# Patient Record
Sex: Female | Born: 2000 | Race: White | Hispanic: Yes | Marital: Single | Smoking: Never smoker
Health system: Southern US, Community
[De-identification: ages and names within clinical notes are randomized; demographics above are authoritative.]

## PROBLEM LIST (undated history)

## (undated) DIAGNOSIS — J302 Other seasonal allergic rhinitis: Secondary | ICD-10-CM

## (undated) HISTORY — DX: Other seasonal allergic rhinitis: J30.2

---
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
## 2020-11-04 ENCOUNTER — Other Ambulatory Visit: Payer: Self-pay

## 2020-11-04 ENCOUNTER — Encounter: Payer: Self-pay | Admitting: Family Medicine

## 2020-11-04 ENCOUNTER — Encounter: Payer: Medicaid Other | Admitting: Family Medicine

## 2020-11-04 ENCOUNTER — Ambulatory Visit (INDEPENDENT_AMBULATORY_CARE_PROVIDER_SITE_OTHER): Payer: Medicaid Other | Admitting: Family Medicine

## 2020-11-04 VITALS — BP 116/78 | HR 80 | Ht 65.0 in | Wt 153.0 lb

## 2020-11-04 DIAGNOSIS — Z0000 Encounter for general adult medical examination without abnormal findings: Secondary | ICD-10-CM

## 2020-11-04 DIAGNOSIS — Z1159 Encounter for screening for other viral diseases: Secondary | ICD-10-CM | POA: Diagnosis not present

## 2020-11-04 NOTE — Patient Instructions (Signed)
Everything seems to be doing well, I hope school goes well. Contact our office if you are having any issues or concerns.

## 2020-11-04 NOTE — Progress Notes (Signed)
   Subjective:  CC -- Annual Physical; With no complaints Pt reports no concerns at this time. Is going to school in [place] and is moving this weekend   Social: Alcohol Use: no  Tobacco Use: no  Other Drugs: no  Risky Sexual Behavior: no - has never been sexually active Depression: no   - PHQ9 score: 1 Support and Life at Home: yes   ROS- Denies weight loss/gain, chest pain, shortness of breath, asthma symptoms, vaginal discharge or odor.  Past Medical History Patient Active Problem List   Diagnosis Date Noted   Encounter for BCP (birth control pills) initial prescription 08/19/2019   Annual physical exam 05/19/2019   Allergic rhinitis with postnasal drip 05/19/2019   Chronic bilateral low back pain without sciatica 05/19/2019   Irregular periods 08/01/2016   Acne vulgaris 08/01/2016    Medications- reviewed and updated Current Outpatient Medications  Medication Sig Dispense Refill   fexofenadine (ALLEGRA) 60 MG tablet Take 60 mg by mouth daily.     Norethindrone Acetate-Ethinyl Estradiol (JUNEL 1.5/30) 1.5-30 MG-MCG tablet Take 1 tablet by mouth daily. 90 tablet 3   No current facility-administered medications for this visit.    Objective: BP 116/78   Pulse 80   Ht 5\' 5"  (1.651 m)   Wt 153 lb (69.4 kg)   LMP 10/31/2020 (Exact Date)   BMI 25.46 kg/m  Gen: NAD, alert, cooperative with exam HEENT: NCAT, EOMI, PERRL CV: RRR, good S1/S2, no murmur Resp: CTABL, no wheezes, non-labored Abd: Soft, Non Tender, Non Distended, BS present, no guarding or organomegaly Genital Exam: not done, not indicated Ext: No edema, warm Neuro: Alert and oriented, No gross deficits   Assessment/Plan:  Annual check-up Patient with no issues and no concerns. BMI is minimally elevated at 25.46. Patient is not sexually active and has no concerning social risk factors. Care gap for Hepatitis c screening, Flu vaccine (not yet available), and COVID booster (patient reports she has already  received).   Orders Placed This Encounter  Procedures   Hepatitis C Antibody    No orders of the defined types were placed in this encounter.    11/02/2020, DO, PGY-1 11/04/2020 12:03 PM

## 2020-11-05 LAB — HEPATITIS C ANTIBODY: Hep C Virus Ab: <0.1 s/co ratio (ref 0.0–0.9)

## 2021-08-22 ENCOUNTER — Encounter: Payer: Self-pay | Admitting: *Deleted

## 2023-07-21 NOTE — Progress Notes (Unsigned)
   SUBJECTIVE:   Chief compliant/HPI: annual examination  Diane Faulkner is a 23 y.o. who presents today for an annual exam.   Review of systems form notable for ***.   Updated history tabs and problem list ***.   OBJECTIVE:   There were no vitals taken for this visit.  ***  ASSESSMENT/PLAN:   Assessment & Plan Annual physical exam PHQ score ***, reviewed and discussed. Blood pressure reviewed and at goal ***.  Asked about intimate partner violence and patient reports ***.  The patient currently uses *** for contraception. Folate recommended as appropriate, minimum of 400 mcg per day.  Advanced directives ***   Considered the following items based upon USPSTF recommendations: HIV testing: {discussed/ordered:14545} Hepatitis C: {discussed/ordered:14545} Hepatitis B: {discussed/ordered:14545} Syphilis if at high risk: {discussed/ordered:14545} GC/CT {GC/CT screening :23818} Lipid panel (nonfasting or fasting) discussed based upon AHA recommendations and {ordered not order:23822}.  Consider repeat every 4-6 years.  Reviewed risk factors for latent tuberculosis and {not indicated/requested/declined:14582}  Discussed family history, BRCA testing {not indicated/requested/declined:14582}. Tool used to risk stratify was Pedigree Assessment tool ***  Cervical cancer screening: {PAPTYPE:23819} Immunizations ***  {MYCHARTLIST:32522}  Follow up in 1  *** year or sooner if indicated.  Cervical cancer screening Due for pap. Physical exam significant for*** discharge.  Patient is not*** interested in STI screening.   - Pap smear performed, will follow results - STD screening performed, will follow results *** - Labs: RPR, HIV collected, will follow results *** - STD and safe sex counseling provided in AVS - Follow-up as needed  Clyda Dark, DO [facility]

## 2023-07-21 NOTE — Assessment & Plan Note (Signed)
 PHQ score 0, reviewed and discussed. Blood pressure reviewed and at goal.  Never been sexually active. Not on any form contraception. LMP on 4/05 and her periods are pretty regular. Would like to start OCPs.   Considered the following items based upon USPSTF recommendations: HIV testing: declined Hepatitis C: previously ordered Hepatitis B: declined Syphilis if at high risk: declined GC/CT declined  Cervical cancer screening: due for Pap today, cytology alone ordered (HPV if ASCUS) Immunizations - got a Tdap booster in 2023 and Meningococcal done today  Already signed up for MyChart  Follow up in 1 year or sooner if indicated.

## 2023-07-22 ENCOUNTER — Other Ambulatory Visit (HOSPITAL_COMMUNITY)
Admission: RE | Admit: 2023-07-22 | Discharge: 2023-07-22 | Disposition: A | Discharge status: Home / Self Care | Source: Ambulatory Visit | Attending: Family Medicine | Admitting: Family Medicine

## 2023-07-22 ENCOUNTER — Ambulatory Visit: Admitting: Family Medicine

## 2023-07-22 VITALS — BP 114/82 | HR 98 | Ht 64.0 in | Wt 160.6 lb

## 2023-07-22 DIAGNOSIS — N926 Irregular menstruation, unspecified: Secondary | ICD-10-CM

## 2023-07-22 DIAGNOSIS — Z23 Encounter for immunization: Secondary | ICD-10-CM | POA: Diagnosis present

## 2023-07-22 DIAGNOSIS — Z0000 Encounter for general adult medical examination without abnormal findings: Secondary | ICD-10-CM

## 2023-07-22 DIAGNOSIS — Z124 Encounter for screening for malignant neoplasm of cervix: Secondary | ICD-10-CM

## 2023-07-22 DIAGNOSIS — Z30011 Encounter for initial prescription of contraceptive pills: Secondary | ICD-10-CM

## 2023-07-22 MED ORDER — NORETHINDRONE ACET-ETHINYL EST 1.5-30 MG-MCG PO TABS: 1.0000 | ORAL_TABLET | Freq: Every day | ORAL | 3 refills | Status: AC

## 2023-07-22 NOTE — Patient Instructions (Addendum)
 It was great to see you today! Thank you for choosing [facility] for your primary care. Diane Faulkner was seen for annual and pap smear.  Today we addressed: Pap smear today will follow up results Refilled birth control to help with irregular periods.   We are checking some labs today. I will send you a MyChart message with your results, per your preference. If you do not hear about your labs in the next 2 weeks, please call the office.  You should return to our clinic Return in about 1 year (around 07/21/2024) for annual. Please arrive 15 minutes before your appointment to ensure smooth check in process.  We appreciate your efforts in making this happen.  Thank you for allowing me to participate in your care, Clyda Dark, DO 07/22/2023, 4:29 PM PGY-1, [facility]

## 2023-07-22 NOTE — Assessment & Plan Note (Addendum)
 Would like OCPs to help with her irregular periods. Didn't have her period in Feb and March, last one was in [place]. Never been sexually active and LMP 06/22/23.  No history of migraines with auras. - Junel Fe 1.5/30 mg

## 2023-07-24 ENCOUNTER — Encounter: Payer: Self-pay | Admitting: Family Medicine

## 2023-07-24 LAB — CYTOLOGY - PAP: Diagnosis: NEGATIVE
